# Patient Record
Sex: Male | Born: 1965 | Race: White | Hispanic: No | Marital: Married
Health system: Southern US, Community
[De-identification: ages and names within clinical notes are randomized; demographics above are authoritative.]

---

## 2011-10-01 ENCOUNTER — Other Ambulatory Visit: Payer: Self-pay | Admitting: Family Medicine

## 2011-10-01 DIAGNOSIS — R748 Abnormal levels of other serum enzymes: Secondary | ICD-10-CM

## 2011-10-07 ENCOUNTER — Other Ambulatory Visit: Payer: Self-pay

## 2011-11-23 ENCOUNTER — Other Ambulatory Visit: Payer: Self-pay

## 2011-11-25 ENCOUNTER — Ambulatory Visit
Admission: RE | Admit: 2011-11-25 | Discharge: 2011-11-25 | Disposition: A | Discharge status: Home / Self Care | Payer: BC Managed Care – PPO | Source: Ambulatory Visit | Attending: Family Medicine | Admitting: Family Medicine

## 2011-11-25 DIAGNOSIS — R748 Abnormal levels of other serum enzymes: Secondary | ICD-10-CM

## 2012-10-17 IMAGING — US US ABDOMEN COMPLETE
1 series · 14 of 25 positions shown · non-contrast
Comparison: None.

CLINICAL DATA: Abnormal liver function tests

COMPLETE ABDOMINAL ULTRASOUND

[Series 1: us abdomen complete · 0.31mm/px · 14 of 83 slices shown]
[im 1/83]
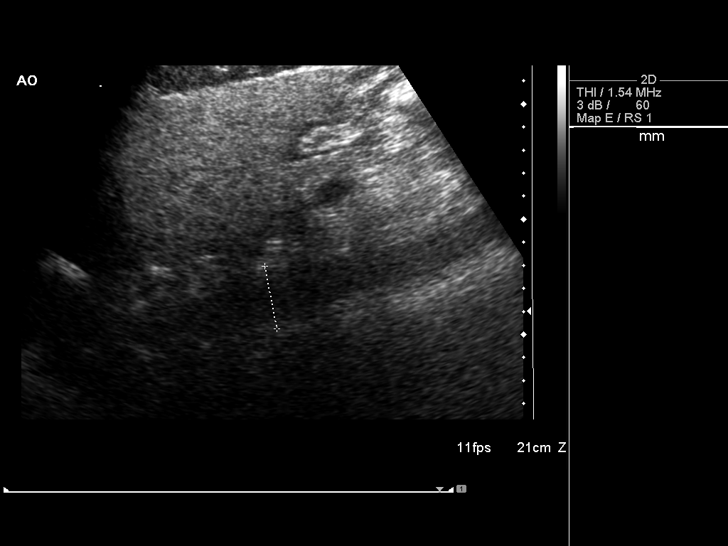
[im 7/83]
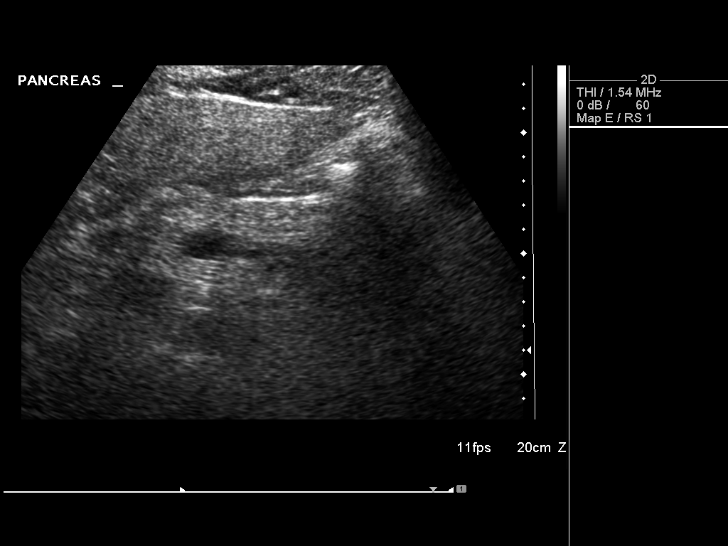
[im 14/83]
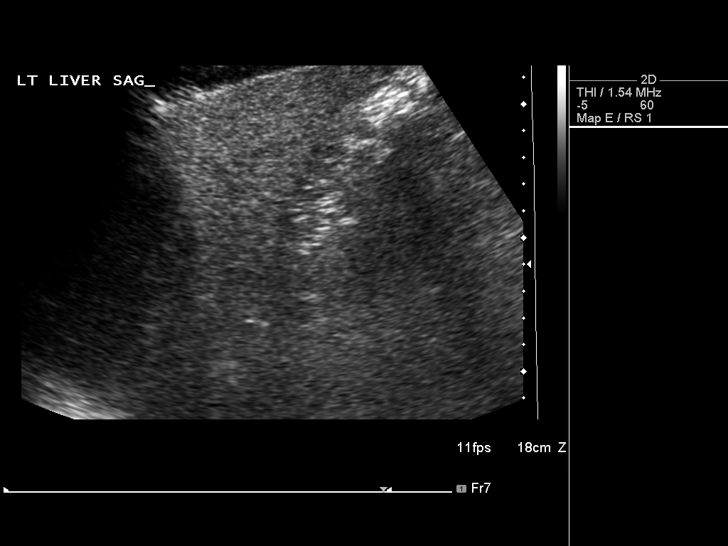
[im 21/83]
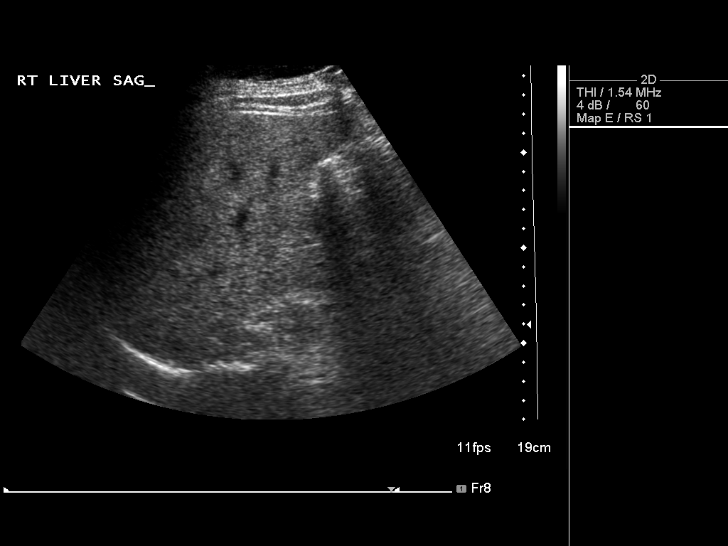
[im 28/83]
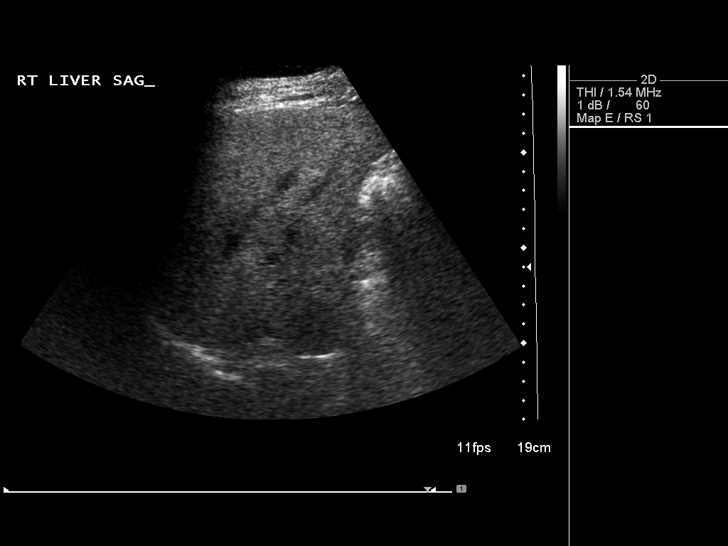
[im 31/83]
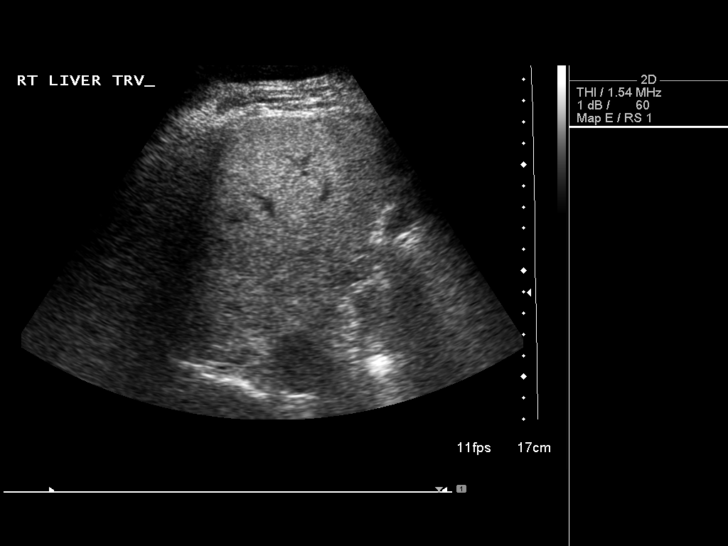
[im 38/83]
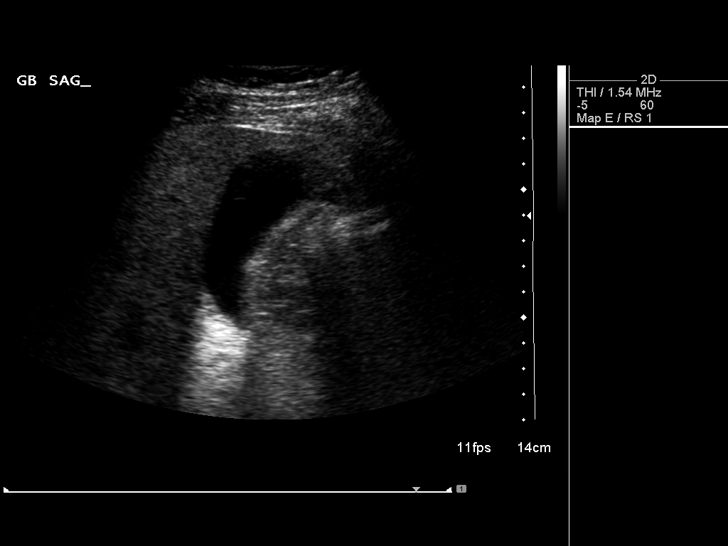
[im 45/83]
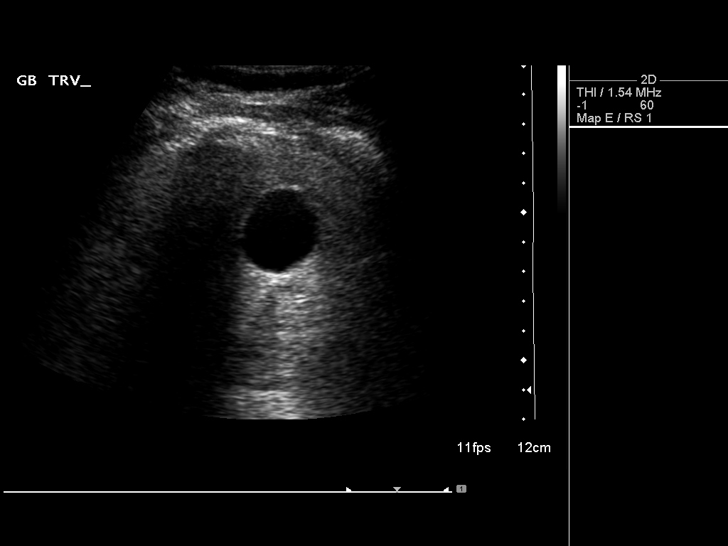
[im 52/83]
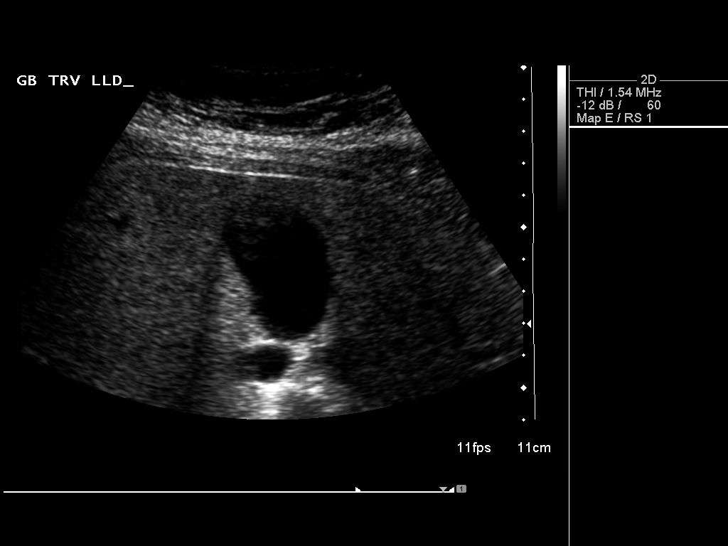
[im 55/83]
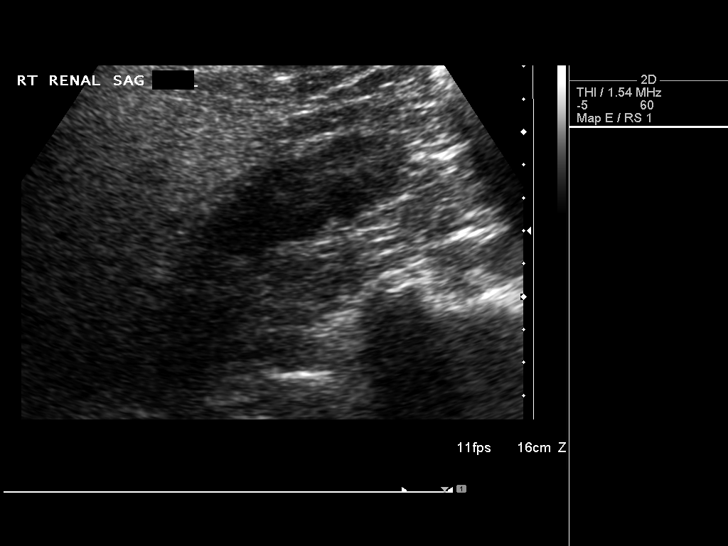
[im 62/83]
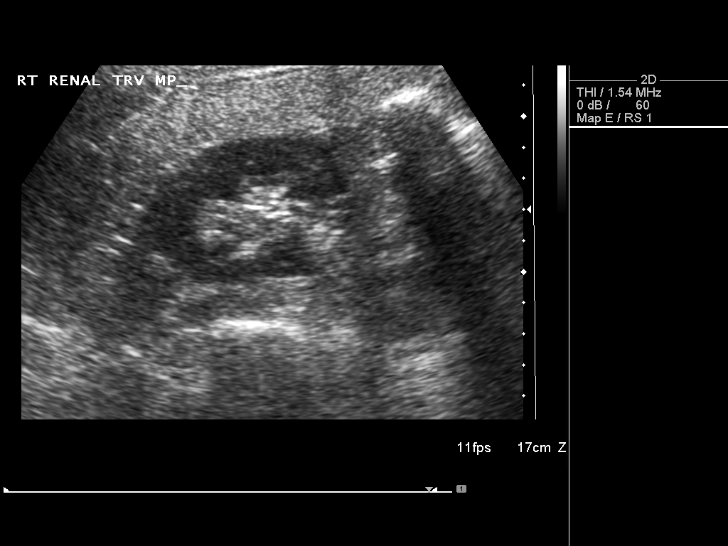
[im 69/83]
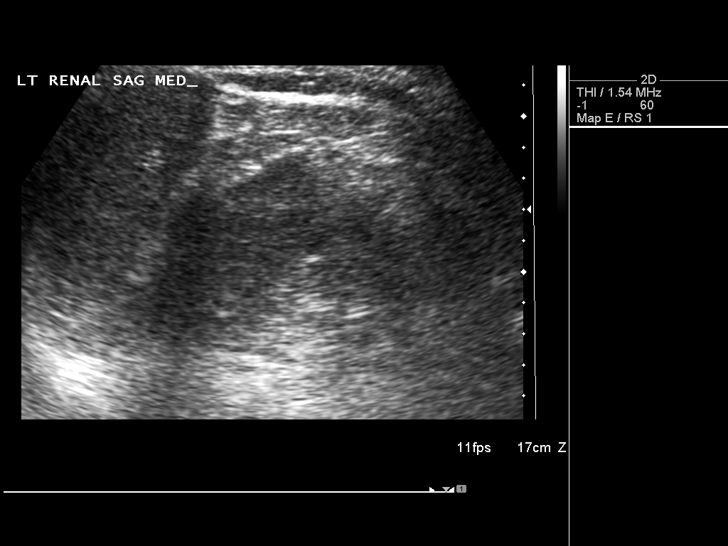
[im 76/83]
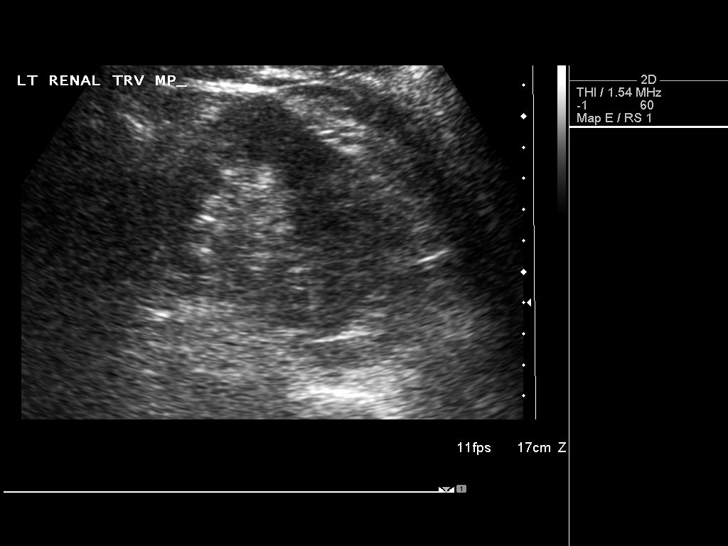
[im 83/83]
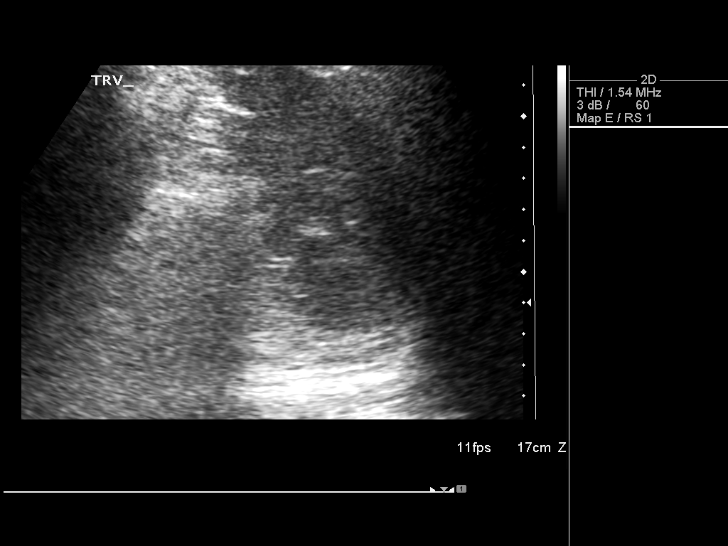

[14 of 25 positions shown; findings below may reference images not displayed]

FINDINGS: Gallbladder:  The gallbladder is visualized and no gallstones are
noted.  There is no pain over the gallbladder with compression.

Common bile duct:  The common bile duct is normal measuring 3.6 mm
in diameter.

Liver:  The liver is diffusely echogenic consistent with fatty
infiltration.  No focal abnormality is seen.

IVC:  The IVC is unremarkable.

Pancreas:  Portions of the pancreas are obscured by bowel gas.

Spleen:  The spleen is normal measuring 4.8 cm sagittally.

Right Kidney:  No hydronephrosis is seen.  The right kidney
measures 11.2 cm sagittally.

Left Kidney:  No hydronephrosis.  The left kidney measures 11.8 cm.

Abdominal aorta:  The abdominal aorta is normal in caliber.
IMPRESSION: 1.  Fatty infiltration of the liver.  No focal abnormality.
2.  No gallstones.
3.  Portions of the pancreas are obscured by bowel gas.

## 2020-02-01 ENCOUNTER — Ambulatory Visit: Payer: Self-pay | Attending: Internal Medicine

## 2020-02-01 DIAGNOSIS — Z23 Encounter for immunization: Secondary | ICD-10-CM

## 2020-02-01 NOTE — Progress Notes (Signed)
   [facility]ov[facility] Vaccination [facility]  Name:  Simran Mannis    MRN: [id number] DOB: 1966-09-17  02/01/2020  Mr. Pickelsimer was observed post Covid-19 immunization for 15 minutes without incident. He was provided with Vaccine Information Sheet and instruction to access the V-Safe system.   Mr. Vanatta was instructed to call 911 with any severe reactions post vaccine: Marland Kitchen Difficulty breathing  . Swelling of face and throat  . A fast heartbeat  . A bad rash all over body  . Dizziness and weakness   Immunizations Administered    Name Date Dose VIS Date Route   Moderna COVID-19 Vaccine 02/01/2020  9:19 AM 0.5 mL 09/18/2019 Intramuscular   Manufacturer: Moderna   Lot: [id number]   NDC: 58682-574-93

## 2020-03-04 ENCOUNTER — Ambulatory Visit: Payer: Self-pay | Attending: Internal Medicine

## 2020-03-04 DIAGNOSIS — Z23 Encounter for immunization: Secondary | ICD-10-CM

## 2020-03-04 NOTE — Progress Notes (Signed)
   Covid-19 Vaccination Clinic  Name:  Allen Alvarez    MRN: [id number] DOB: 28-Dec-1965  03/04/2020  Mr. Allen Alvarez was observed post Covid-19 immunization for 15 minutes without incident. He was provided with Vaccine Information Sheet and instruction to access the V-Safe system.   Mr. Allen Alvarez was instructed to call 911 with any severe reactions post vaccine: Marland Kitchen Difficulty breathing  . Swelling of face and throat  . A fast heartbeat  . A bad rash all over body  . Dizziness and weakness   Immunizations Administered    Name Date Dose VIS Date Route   Moderna COVID-19 Vaccine 03/04/2020  9:20 AM 0.5 mL 09/2019 Intramuscular   Manufacturer: Moderna   Lot: [id number]   NDC: 00867-619-50
